# Patient Record
Sex: Male | Born: 1980 | Race: White | Hispanic: No | Marital: Single | Smoking: Current every day smoker
Health system: Southern US, Community
[De-identification: ages and names within clinical notes are randomized; demographics above are authoritative.]

## PROBLEM LIST (undated history)

## (undated) HISTORY — PX: SHOULDER ARTHROSCOPY: SHX128

---

## 2016-01-08 ENCOUNTER — Emergency Department: Payer: No Typology Code available for payment source

## 2016-01-08 ENCOUNTER — Encounter: Payer: Self-pay | Admitting: Emergency Medicine

## 2016-01-08 ENCOUNTER — Emergency Department
Admission: EM | Admit: 2016-01-08 | Discharge: 2016-01-08 | Disposition: A | Discharge status: Home / Self Care | Payer: No Typology Code available for payment source | Attending: Emergency Medicine | Admitting: Emergency Medicine

## 2016-01-08 DIAGNOSIS — Y9351 Activity, roller skating (inline) and skateboarding: Secondary | ICD-10-CM | POA: Insufficient documentation

## 2016-01-08 DIAGNOSIS — F17210 Nicotine dependence, cigarettes, uncomplicated: Secondary | ICD-10-CM | POA: Diagnosis not present

## 2016-01-08 DIAGNOSIS — Y9289 Other specified places as the place of occurrence of the external cause: Secondary | ICD-10-CM | POA: Insufficient documentation

## 2016-01-08 DIAGNOSIS — S43034A Inferior dislocation of right humerus, initial encounter: Secondary | ICD-10-CM | POA: Insufficient documentation

## 2016-01-08 DIAGNOSIS — Y998 Other external cause status: Secondary | ICD-10-CM | POA: Insufficient documentation

## 2016-01-08 DIAGNOSIS — S43014A Anterior dislocation of right humerus, initial encounter: Secondary | ICD-10-CM | POA: Diagnosis not present

## 2016-01-08 DIAGNOSIS — S43004A Unspecified dislocation of right shoulder joint, initial encounter: Secondary | ICD-10-CM

## 2016-01-08 DIAGNOSIS — S4991XA Unspecified injury of right shoulder and upper arm, initial encounter: Secondary | ICD-10-CM | POA: Diagnosis present

## 2016-01-08 MED ORDER — FENTANYL CITRATE (PF) 100 MCG/2ML IJ SOLN
50.0000 ug | INTRAMUSCULAR | Status: AC
  Administered 2016-01-08: 50 ug via INTRAVENOUS
  Filled 2016-01-08: qty 2

## 2016-01-08 MED ORDER — KETOROLAC TROMETHAMINE 30 MG/ML IJ SOLN
30.0000 mg | Freq: Once | INTRAMUSCULAR | Status: AC
  Administered 2016-01-08: 30 mg via INTRAVENOUS
  Filled 2016-01-08: qty 1

## 2016-01-08 MED ORDER — HYDROCODONE-ACETAMINOPHEN 5-325 MG PO TABS: 1.0000 | ORAL_TABLET | Freq: Four times a day (QID) | ORAL | Status: DC | PRN

## 2016-01-08 NOTE — ED Provider Notes (Signed)
[facility] Emergency Department Provider Note  ____________________________________________  Time seen: Approximately 6:02 PM  I have reviewed the triage vital signs and the nursing notes.   HISTORY  Chief Complaint Shoulder Injury    HPI Fernando Olson is a 35 y.o. male denies previous medical history.  Patient reports he was skateboarding when he fell backwards. He scraped his right elbow slightly, but started having severe pain in his right shoulder and thinks it is "dislocated". Denies any pain numbness tingling or weakness in the right hand, wrist, forearm. Denies pain in the elbow except it feels like "road rash". No loss of consciousness do not strike his head injures back or neck. No other injury.He thinks he had a tetanus shot a few years ago.  History reviewed. No pertinent past medical history.  There are no active problems to display for this patient.   History reviewed. No pertinent past surgical history.  Current Outpatient Rx  Name  Route  Sig  Dispense  Refill  . HYDROcodone-acetaminophen (NORCO/VICODIN) 5-325 MG tablet   Oral   Take 1 tablet by mouth every 6 (six) hours as needed for moderate pain.   15 tablet   0     Allergies Review of patient's allergies indicates no known allergies.  No family history on file.  Social History Social History  Substance Use Topics  . Smoking status: Current Every Day Smoker -- 1.00 packs/day    Types: Cigarettes  . Smokeless tobacco: Never Used  . Alcohol Use: Yes     Comment: daily wine    Review of Systems Constitutional: No fever/chills Eyes: No visual changes. ENT: No sore throat. Cardiovascular: Denies chest pain. Respiratory: Denies shortness of breath. Gastrointestinal: No abdominal pain.  No nausea, no vomiting.  No diarrhea.  No constipation. Genitourinary: Negative for dysuria. Musculoskeletal: Negative for back pain. Skin: Negative for rash except around the right  elbow. Neurological: Negative for headaches, focal weakness or numbness.  10-point ROS otherwise negative.  ____________________________________________   PHYSICAL EXAM:  VITAL SIGNS: ED Triage Vitals  Enc Vitals Group     BP 01/08/16 1605 134/90 mmHg     Pulse Rate 01/08/16 1605 83     Resp 01/08/16 1605 18     Temp 01/08/16 1605 98.3 F (36.8 C)     Temp Source 01/08/16 1605 Oral     SpO2 01/08/16 1605 95 %     Weight 01/08/16 1605 170 lb (77.111 kg)     Height 01/08/16 1605  (1.778 m)     Head Cir --      Peak Flow --      Pain Score 01/08/16 1606 5     Pain Loc --      Pain Edu? --      Excl. in GC? --    Constitutional: Alert and oriented. Well appearing and in no acute distress. Eyes: Conjunctivae are normal. PERRL. EOMI. Head: Atraumatic. Nose: No congestion/rhinnorhea. Mouth/Throat: Mucous membranes are moist.  Oropharynx non-erythematous. Neck: No stridor.  No cervical spine tenderness Cardiovascular: Normal rate, regular rhythm. Grossly normal heart sounds.  Good peripheral circulation. Respiratory: Normal respiratory effort.  No retractions. Lungs CTAB. Gastrointestinal: Soft and nontender. No distention. No abdominal bruits. No CVA tenderness. Right hand Median, ulnar, radial motor intact. Cap refill less than 2 seconds all digits. Strong radial pulse. 5 out of 5 strength throughout the hand intrinsics, flexion and extension at the wrist. No evidence of trauma.  Left hand Median, ulnar,  radial motor intact. Cap refill less than 2 seconds all digits. Strong radial pulse. 5 out of 5 strength throughout the hand intrinsics, flexion and extension at the wrist. No evidence of trauma. ____________________________________________  RIGHT Right upper extremity demonstrates normal strength, good use of all muscles except for pain and slight concave deformity just below the right shoulder joint. No edema bruising or contusions of the right shoulder/upper arm,  right elbow, right forearm / hand. There is a small approximately quarter size abrasion over the right elbow without evidence of foreign body, laceration. Patient ranges the elbow well without pain there is no tenderness to bony prominences. Full range of motion of the right right upper extremity without pain except at the shoulder joint. Strong radial pulse. Intact median/ulnar/radial neuro-muscular exam.  LEFT Left upper extremity demonstrates normal strength, good use of all muscles. No edema bruising or contusions of the left shoulder/upper arm, left elbow, left forearm / hand. Full range of motion of the left  upper extremity without pain. No evidence of trauma. Strong radial pulse. Intact median/ulnar/radial neuro-muscular exam.   Musculoskeletal: No lower extremity tenderness nor edema.  No joint effusions. Neurologic:  Normal speech and language. No gross focal neurologic deficits are appreciated. No gait instability. Skin:  Skin is warm, dry and intact. No rash noted. Psychiatric: Mood and affect are normal. Speech and behavior are normal.  ____________________________________________   LABS (all labs ordered are listed, but only abnormal results are displayed)  Labs Reviewed - No data to display ____________________________________________  EKG   ____________________________________________  RADIOLOGY  DG Shoulder Right (Final result) Result time: 01/08/16 17:50:29   Final result by Rad Results In Interface (01/08/16 17:50:29)   Narrative:   CLINICAL DATA: Status post reduction  EXAM: RIGHT SHOULDER - 2+ VIEW  COMPARISON: Film from earlier in the same day  FINDINGS: There is been interval reduction of the humeral head dislocation. It now lies within the glenoid fossa. The previously seen avulsion fracture is not well appreciated on this exam.  IMPRESSION: Interval reduction of the humeral head   Electronically Signed By: Alcide Clever M.D. On:  01/08/2016 17:50          DG Shoulder Right (Final result) Result time: 01/08/16 16:50:03   Final result by Rad Results In Interface (01/08/16 16:50:03)   Narrative:   CLINICAL DATA: Recent fall from skateboard with right shoulder pain and inability to move arm, initial encounter  EXAM: RIGHT SHOULDER - 2+ VIEW  COMPARISON: None.  FINDINGS: There is anterior inferior dislocation of the right humeral head with respect to the glenoid. A small defect is noted from the superior aspect of the humeral head consistent with a Hill-Sachs deformity. The bony density is noted adjacent which may represent an acute avulsion related to the dislocation.  IMPRESSION: Right humeral dislocation with apparent avulsion fracture.   Electronically Signed By: Alcide Clever M.D. On: 01/08/2016 16:50    ____________________________________________   PROCEDURES  Procedure(s) performed: Shoulder reduction  Critical Care performed: No  Reduction of dislocation Date/Time: 6:19 PM Performed by: Sharyn Creamer Authorized by: Sharyn Creamer Consent: Verbal consent obtained. Risks and benefits: risks, benefits and alternatives were discussed Consent given by: patient Required items: required blood products, implants, devices, and special equipment available Time out: Immediately prior to procedure a "time out" was called to verify the correct patient, procedure, equipment, support staff and site/side marked as required.   Vitals: Vital signs were monitored during sedation. Patient tolerance: Patient tolerated the procedure well with  no immediate complications. Joint: Right shoulder Reduction technique: FARES  Normal radial pulse, capillary refill, and no sensory exam of the right hand postreduction. No numbness or tingling over the lateral right arm. Patient tolerated procedure extremely well.    ____________________________________________   INITIAL IMPRESSION / ASSESSMENT AND PLAN  / ED COURSE  Pertinent labs & imaging results that were available during my care of the patient were reviewed by me and considered in my medical decision making (see chart for details).  Fall. The patient has right shoulder dislocation, now reduced. No other evidence of injury aside from small abrasion to the right elbow.  I will prescribe the patient a narcotic pain medicine due to their condition which I anticipate will cause at least moderate pain short term. I discussed with the patient safe use of narcotic pain medicines, and that they are not to drive, work in dangerous areas, or ever take more than prescribed (no more than 1 pill every 6 hours). We discussed that this is the type of medication that can be  overdosed on and the risks of this type of medicine. Patient is very agreeable to only use as prescribed and to never use more than prescribed.  Return precautions and treatment recommendations and follow-up discussed with the patient who is agreeable with the plan.  ____________________________________________   FINAL CLINICAL IMPRESSION(S) / ED DIAGNOSES  Final diagnoses:  Shoulder dislocation, right, initial encounter      Sharyn Creamer, MD 01/08/16 1819

## 2016-01-08 NOTE — ED Notes (Signed)
MD Quale at bedside attempting to reduce the dislocated right shoulder, pt stable, NAD noted. Vitals WNL.

## 2016-01-08 NOTE — ED Notes (Signed)
Put sling to right arm on patient, patient stated sling made pain worse and requested to remove sling.

## 2016-01-08 NOTE — Discharge Instructions (Signed)
You have been seen in the Emergency Department (ED) today for a shoulder dislocation.  It was put back in place in the ED.  Please follow up as written with the recommend orthopedic surgeon.  It is important you leave the shoulder immobilizer in place until they tell you to remove it.  Take hydrocodone as prescribed for severe pain. Do not drink alcohol, drive or participate in any other potentially dangerous activities while taking this medication as it may make you sleepy. Do not take this medication with any other sedating medications, either prescription or over-the-counter. If you were prescribed Percocet or Vicodin, do not take these with acetaminophen (Tylenol) as it is already contained within these medications.   This medication is an opiate (or narcotic) pain medication and can be habit forming.  Use it as little as possible to achieve adequate pain control.  Do not use or use it with extreme caution if you have a history of opiate abuse or dependence.  If you are on a pain contract with your primary care doctor or a pain specialist, be sure to let them know you were prescribed this medication today from the [facility] Emergency Department.  This medication is intended for your use only - do not give any to anyone else and keep it in a secure place where nobody else, especially children, have access to it.  It will also cause or worsen constipation, so you may want to consider taking an over-the-counter stool softener while you are taking this medication.  If you have worsening pain or swelling, if the shoulder dislocates again, or if you have any other symptoms that concern you, please return immediately to the Emergency Department.   Shoulder Dislocation A shoulder dislocation happens when the upper arm bone (humerus) moves out of the shoulder joint. The shoulder joint is the part of the shoulder where the humerus, shoulder blade (scapula), and collarbone (clavicle) meet. CAUSES This  condition is often caused by:  A fall.  A hit to the shoulder.  A forceful movement of the shoulder. RISK FACTORS This condition is more likely to develop in people who play sports. SYMPTOMS Symptoms of this condition include:  Deformity of the shoulder.  Intense pain.  Inability to move the shoulder.  Numbness, weakness, or tingling in your neck or down your arm.  Bruising or swelling around your shoulder. DIAGNOSIS This condition is diagnosed with a physical exam. After the exam, tests may be done to check for related problems. Tests that may be done include:  X-ray. This may be done to check for broken bones.  MRI. This may be done to check for damage to the tissues around the shoulder.  Electromyogram. This may be done to check for nerve damage. TREATMENT This condition is treated with a procedure to place the humerus back in the joint. This procedure is called a reduction. There are two types of reduction:  Closed reduction. In this procedure, the humerus is placed back in the joint without surgery. The health care provider uses his or her hands to guide the bone back into place.  Open reduction. In this procedure, the humerus is placed back in the joint with surgery. An open reduction may be recommended if:  You have a weak shoulder joint or weak ligaments.  You have had more than one shoulder dislocation.  The nerves or blood vessels around your shoulder have been damaged. After the humerus is placed back into the joint, your arm will be placed  in a splint or sling to prevent it from moving. You will need to wear the splint or sling until your shoulder heals. When the splint or sling is removed, you may have physical therapy to help improve the range of motion in your shoulder joint. HOME CARE INSTRUCTIONS If You Have a Splint or Sling:  Wear it as told by your health care provider. Remove it only as told by your health care provider.  Loosen it if your fingers  become numb and tingle, or if they turn cold and blue.  Keep it clean and dry. Bathing  Do not take baths, swim, or use a hot tub until your health care provider approves. Ask your health care provider if you can take showers. You may only be allowed to take sponge baths for bathing.  If your health care provider approves bathing and showering, cover your splint or sling with a watertight plastic bag to protect it from water. Do not let the splint or sling get wet. Managing Pain, Stiffness, and Swelling  If directed, apply ice to the injured area.  Put ice in a plastic bag.  Place a towel between your skin and the bag.  Leave the ice on for 20 minutes, 2-3 times per day.  Move your fingers often to avoid stiffness and to decrease swelling.  Raise (elevate) the injured area above the level of your heart while you are sitting or lying down. Driving  Do not drive while wearing a splint or sling on a hand that you use for driving.  Do not drive or operate heavy machinery while taking pain medicine. Activity  Return to your normal activities as told by your health care provider. Ask your health care provider what activities are safe for you.  Perform range-of-motion exercises only as told by your health care provider.  Exercise your hand by squeezing a soft ball. This helps to decrease stiffness and swelling in your hand and wrist. General Instructions  Take over-the-counter and prescription medicines only as told by your health care provider.  Do not use any tobacco products, including cigarettes, chewing tobacco, or e-cigarettes. Tobacco can delay bone and tissue healing. If you need help quitting, ask your health care provider.  Keep all follow-up visits as told by your health care provider. This is important. SEEK MEDICAL CARE IF:  Your splint or sling gets damaged. SEEK IMMEDIATE MEDICAL CARE IF:  Your pain gets worse rather than better.  You lose feeling in your arm or  hand.  Your arm or hand becomes white and cold.   This information is not intended to replace advice given to you by your health care provider. Make sure you discuss any questions you have with your health care provider.   Document Released: 07/26/2001 Document Revised: 07/22/2015 Document Reviewed: 02/23/2015 Elsevier Interactive Patient Education [employer]

## 2016-01-08 NOTE — ED Notes (Signed)
Skateboarding, fell right arm went up when falling.  Possible right shoulder dislocation.

## 2016-01-08 NOTE — ED Notes (Signed)
AAOx3.  Skin warm and dry.  Right shoulder deformity.  +brisk capillary refill. + sensation.

## 2016-01-08 NOTE — ED Notes (Signed)
Patient transported to XRAY 

## 2020-07-07 ENCOUNTER — Emergency Department
Admission: EM | Admit: 2020-07-07 | Discharge: 2020-07-07 | Disposition: A | Discharge status: Home / Self Care | Payer: PRIVATE HEALTH INSURANCE | Attending: Emergency Medicine | Admitting: Emergency Medicine

## 2020-07-07 ENCOUNTER — Other Ambulatory Visit: Payer: Self-pay

## 2020-07-07 DIAGNOSIS — Y9389 Activity, other specified: Secondary | ICD-10-CM | POA: Insufficient documentation

## 2020-07-07 DIAGNOSIS — W3189XA Contact with other specified machinery, initial encounter: Secondary | ICD-10-CM | POA: Insufficient documentation

## 2020-07-07 DIAGNOSIS — Y999 Unspecified external cause status: Secondary | ICD-10-CM | POA: Diagnosis not present

## 2020-07-07 DIAGNOSIS — Z23 Encounter for immunization: Secondary | ICD-10-CM | POA: Diagnosis not present

## 2020-07-07 DIAGNOSIS — Y929 Unspecified place or not applicable: Secondary | ICD-10-CM | POA: Insufficient documentation

## 2020-07-07 DIAGNOSIS — S61512A Laceration without foreign body of left wrist, initial encounter: Secondary | ICD-10-CM | POA: Diagnosis not present

## 2020-07-07 DIAGNOSIS — F17210 Nicotine dependence, cigarettes, uncomplicated: Secondary | ICD-10-CM | POA: Diagnosis not present

## 2020-07-07 MED ORDER — TETANUS-DIPHTH-ACELL PERTUSSIS 5-2.5-18.5 LF-MCG/0.5 IM SUSP: 0.5000 mL | Freq: Once | INTRAMUSCULAR | Status: AC

## 2020-07-07 MED ORDER — CEPHALEXIN 500 MG PO CAPS: 1000.0000 mg | ORAL_CAPSULE | Freq: Two times a day (BID) | ORAL | 0 refills | Status: DC

## 2020-07-07 MED ORDER — TETANUS-DIPHTH-ACELL PERTUSSIS 5-2.5-18.5 LF-MCG/0.5 IM SUSP
INTRAMUSCULAR | Status: AC
  Administered 2020-07-07: 0.5 mL via INTRAMUSCULAR
  Filled 2020-07-07: qty 0.5

## 2020-07-07 NOTE — ED Provider Notes (Signed)
[facility] Emergency Department Provider Note  ____________________________________________  Time seen: Approximately 7:00 PM  I have reviewed the triage vital signs and the nursing notes.   HISTORY  Chief Complaint Laceration    HPI Fernando Olson is a 39 y.o. male who presents the emergency department complaining of a laceration to the left wrist.  Patient was using an angle grinder when it slipped.  Patient sustained a laceration to the left wrist.  Full range of motion to the left wrist and all digits.  Bleeding was controlled by direct pressure.  Unsure of last tetanus shot.  No other injury or complaint.         History reviewed. No pertinent past medical history.  There are no problems to display for this patient.   History reviewed. No pertinent surgical history.  Prior to Admission medications   Medication Sig Start Date End Date Taking? Authorizing Provider  cephALEXin (KEFLEX) 500 MG capsule Take 2 capsules (1,000 mg total) by mouth 2 (two) times daily. 07/07/20   Jaizon Deroos, Delorise Royals, PA-C  HYDROcodone-acetaminophen (NORCO/VICODIN) 5-325 MG tablet Take 1 tablet by mouth every 6 (six) hours as needed for moderate pain. 01/08/16   Sharyn Creamer, MD    Allergies Patient has no known allergies.  No family history on file.  Social History Social History   Tobacco Use  . Smoking status: Current Every Day Smoker    Packs/day: 1.00    Types: Cigarettes  . Smokeless tobacco: Never Used  Substance Use Topics  . Alcohol use: Yes    Comment: daily wine  . Drug use: No     Review of Systems  Constitutional: No fever/chills Eyes: No visual changes. No discharge ENT: No upper respiratory complaints. Cardiovascular: no chest pain. Respiratory: no cough. No SOB. Gastrointestinal: No abdominal pain.  No nausea, no vomiting.  No diarrhea.  No constipation. Musculoskeletal: Negative for musculoskeletal pain. Skin: Left wrist  laceration Neurological: Negative for headaches, focal weakness or numbness. 10-point ROS otherwise negative.  ____________________________________________   PHYSICAL EXAM:  VITAL SIGNS: ED Triage Vitals [07/07/20 1600]  Enc Vitals Group     BP 103/71     Pulse Rate 81     Resp 18     Temp 98.3 F (36.8 C)     Temp src      SpO2 97 %     Weight 155 lb (70.3 kg)     Height 5\' 10"  (1.778 m)     Head Circumference      Peak Flow      Pain Score 7     Pain Loc      Pain Edu?      Excl. in GC?      Constitutional: Alert and oriented. Well appearing and in no acute distress. Eyes: Conjunctivae are normal. PERRL. EOMI. Head: Atraumatic. ENT:      Ears:       Nose: No congestion/rhinnorhea.      Mouth/Throat: Mucous membranes are moist.  Neck: No stridor.    Cardiovascular: Normal rate, regular rhythm. Normal S1 and S2.  Good peripheral circulation. Respiratory: Normal respiratory effort without tachypnea or retractions. Lungs CTAB. Good air entry to the bases with no decreased or absent breath sounds. Musculoskeletal: Full range of motion to all extremities. No gross deformities appreciated. Neurologic:  Normal speech and language. No gross focal neurologic deficits are appreciated.  Skin:  Skin is warm, dry and intact. No rash noted.  Visualization of the left  wrist reveals approximately 4 cm laceration.  This involves the epidermal and dermal tissue but does not involve subcutaneous tissue or deep underlying structures.  Good range of motion.  Full range of motion to all digits distally.  Edges are smooth in nature.  Debris in the medial aspect of the laceration consistent with granular debris but no solid foreign body. Psychiatric: Mood and affect are normal. Speech and behavior are normal. Patient exhibits appropriate insight and judgement.   ____________________________________________   LABS (all labs ordered are listed, but only abnormal results are displayed)  Labs  Reviewed - No data to display ____________________________________________  EKG   ____________________________________________  RADIOLOGY   No results found.  ____________________________________________    PROCEDURES  Procedure(s) performed:    Marland KitchenMarland KitchenLaceration Repair  Date/Time: 07/07/2020 7:32 PM Performed by: Racheal Patches, PA-C Authorized by: Racheal Patches, PA-C   Consent:    Consent obtained:  Verbal   Consent given by:  Patient   Risks discussed:  Pain, poor cosmetic result, poor wound healing, infection and retained foreign body Anesthesia (see MAR for exact dosages):    Anesthesia method:  None Laceration details:    Location:  Shoulder/arm   Shoulder/arm location:  L lower arm   Length (cm):  4 Repair type:    Repair type:  Simple Pre-procedure details:    Preparation:  Patient was prepped and draped in usual sterile fashion Exploration:    Hemostasis achieved with:  Direct pressure   Wound exploration: wound explored through full range of motion and entire depth of wound probed and visualized     Wound extent: foreign bodies/material     Wound extent: no muscle damage noted, no nerve damage noted, no tendon damage noted, no underlying fracture noted and no vascular damage noted     Foreign bodies/material:  Grinding wheel dust   Contaminated: yes   Treatment:    Area cleansed with:  Betadine and saline   Amount of cleaning:  Extensive   Irrigation solution:  Sterile saline   Irrigation volume:  2 L   Irrigation method:  Syringe   Visualized foreign bodies/material removed: yes   Skin repair:    Repair method: Derma-clips. Approximation:    Approximation:  Close Post-procedure details:    Dressing:  Splint for protection   Patient tolerance of procedure:  Tolerated well, no immediate complications Comments:     Wound was thoroughly cleansed out.  All contamination visible was removed.  Patient adamantly requested closure other than  sutures.  Derma clips applied with 2 derma clips applied.  Wrist is splinted to limit range of motion.      Medications  Tdap (BOOSTRIX) injection 0.5 mL (0.5 mLs Intramuscular Given 07/07/20 1930)     ____________________________________________   INITIAL IMPRESSION / ASSESSMENT AND PLAN / ED COURSE  Pertinent labs & imaging results that were available during my care of the patient were reviewed by me and considered in my medical decision making (see chart for details).  Review of the [place] CSRS was performed in accordance of the [facility] prior to dispensing any controlled drugs.           Patient's diagnosis is consistent with wrist laceration.  Patient presented to emergency department after sustaining a laceration to left anterior wrist from a grinding wheel.  Patient had some debris in the wound that was thoroughly irrigated, cleansed.  All visible retained material was removed.  Patient adamantly did not want sutures.  I discussed pros and  cons of sutures, alternative methods of closure.  Patient requested any method other than sutures.  As such derma clips are applied patient's wrist is splinted.  Edges are well approximated.  Patient tolerated well.  Wound care instructions discussed with the patient.  Patient will have his tetanus shot updated at this time.  Antibiotics prophylactically..  Patient is given ED precautions to return to the ED for any worsening or new symptoms.     ____________________________________________  FINAL CLINICAL IMPRESSION(S) / ED DIAGNOSES  Final diagnoses:  Laceration of left wrist, initial encounter      NEW MEDICATIONS STARTED DURING THIS VISIT:  ED Discharge Orders         Ordered    cephALEXin (KEFLEX) 500 MG capsule  2 times daily        07/07/20 1937              This chart was dictated using voice recognition software/Dragon. Despite best efforts to proofread, errors can occur which can change the meaning. Any change was  purely unintentional.    Racheal Patches, PA-C 07/07/20 1939    Gilles Chiquito, MD 07/07/20 1944

## 2020-07-07 NOTE — ED Triage Notes (Signed)
Pt comes via POV from home with c/o laceration to left wrist following a grinder accident. Pt states deep cut to wrist.  Pt has bandage in place with bleeding controlled.  Pt states this happened at work. Pt may file for WC. Pt states employer sent him here

## 2020-07-07 NOTE — ED Notes (Signed)
Pt presents to the ED for a laceration to the L wrist after an accident with a grinder. Pt is A&Ox4 and NAD. Strong pulses bilaterally and able to bend fingers at this time.

## 2020-07-07 NOTE — ED Notes (Signed)
No reaction noted to the injection site. 

## 2022-01-28 ENCOUNTER — Emergency Department
Admission: EM | Admit: 2022-01-28 | Discharge: 2022-01-28 | Disposition: A | Discharge status: Home / Self Care | Payer: PRIVATE HEALTH INSURANCE | Attending: Emergency Medicine | Admitting: Emergency Medicine

## 2022-01-28 ENCOUNTER — Other Ambulatory Visit: Payer: Self-pay

## 2022-01-28 ENCOUNTER — Emergency Department: Payer: PRIVATE HEALTH INSURANCE

## 2022-01-28 DIAGNOSIS — R600 Localized edema: Secondary | ICD-10-CM | POA: Insufficient documentation

## 2022-01-28 DIAGNOSIS — M7989 Other specified soft tissue disorders: Secondary | ICD-10-CM

## 2022-01-28 DIAGNOSIS — R609 Edema, unspecified: Secondary | ICD-10-CM

## 2022-01-28 DIAGNOSIS — L03116 Cellulitis of left lower limb: Secondary | ICD-10-CM | POA: Insufficient documentation

## 2022-01-28 LAB — COMPREHENSIVE METABOLIC PANEL
ALT: 46 U/L — ABNORMAL HIGH (ref 0–44)
AST: 33 U/L (ref 15–41)
Albumin: 3.5 g/dL (ref 3.5–5.0)
Alkaline Phosphatase: 79 U/L (ref 38–126)
Anion gap: 5 (ref 5–15)
BUN: 14 mg/dL (ref 6–20)
CO2: 28 mmol/L (ref 22–32)
Calcium: 8.8 mg/dL — ABNORMAL LOW (ref 8.9–10.3)
Chloride: 100 mmol/L (ref 98–111)
Creatinine, Ser: 1.06 mg/dL (ref 0.61–1.24)
GFR, Estimated: >60 mL/min (ref >60)
Glucose, Bld: 100 mg/dL — ABNORMAL HIGH (ref 70–99)
Potassium: 4.6 mmol/L (ref 3.5–5.1)
Sodium: 133 mmol/L — ABNORMAL LOW (ref 135–145)
Total Bilirubin: 1 mg/dL (ref 0.3–1.2)
Total Protein: 7.6 g/dL (ref 6.5–8.1)

## 2022-01-28 LAB — BRAIN NATRIURETIC PEPTIDE: B Natriuretic Peptide: 21.4 pg/mL (ref 0.0–100.0)

## 2022-01-28 LAB — CBC WITH DIFFERENTIAL/PLATELET
Abs Immature Granulocytes: 0.02 10*3/uL (ref 0.00–0.07)
Basophils Absolute: 0 10*3/uL (ref 0.0–0.1)
Basophils Relative: 1 %
Eosinophils Absolute: 0.2 10*3/uL (ref 0.0–0.5)
Eosinophils Relative: 4 %
HCT: 39.6 % (ref 39.0–52.0)
Hemoglobin: 12.8 g/dL — ABNORMAL LOW (ref 13.0–17.0)
Immature Granulocytes: 0 %
Lymphocytes Relative: 24 %
Lymphs Abs: 1.6 10*3/uL (ref 0.7–4.0)
MCH: 28.4 pg (ref 26.0–34.0)
MCHC: 32.3 g/dL (ref 30.0–36.0)
MCV: 88 fL (ref 80.0–100.0)
Monocytes Absolute: 0.8 10*3/uL (ref 0.1–1.0)
Monocytes Relative: 11 %
Neutro Abs: 4.2 10*3/uL (ref 1.7–7.7)
Neutrophils Relative %: 60 %
Platelets: 506 10*3/uL — ABNORMAL HIGH (ref 150–400)
RBC: 4.5 MIL/uL (ref 4.22–5.81)
RDW: 12.7 % (ref 11.5–15.5)
WBC: 6.8 10*3/uL (ref 4.0–10.5)
nRBC: 0 % (ref 0.0–0.2)

## 2022-01-28 LAB — TSH: TSH: 0.742 u[IU]/mL (ref 0.350–4.500)

## 2022-01-28 LAB — SEDIMENTATION RATE: Sed Rate: 63 mm/hr — ABNORMAL HIGH (ref 0–15)

## 2022-01-28 MED ORDER — CEPHALEXIN 500 MG PO CAPS: 500.0000 mg | ORAL_CAPSULE | Freq: Three times a day (TID) | ORAL | 0 refills | Status: DC

## 2022-01-28 NOTE — ED Triage Notes (Addendum)
Patient to ER via POV with complaints of bilateral leg swelling and possible cellulitis.  ? 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 States he often gets splinters/ metal splinters in his feet and legs.  ? ?Patient did a telehealth visit and was started on bactrim, has been taking it for 8 days with some initial improvement but missed one dose and states his legs have worsened. Has also been wearing compression socks with little improvement in swelling.  ? ?Unsure of last tetanus shot.  ?

## 2022-01-28 NOTE — ED Provider Notes (Signed)
? ?[facility] ?Provider Note ? ? ? Event Date/Time  ? First MD Initiated Contact with Patient 01/28/22 1001   ?  (approximate) ? ? ?History  ? ?Leg Swelling ? ? ?HPI ? ?Fernando Olson is a 41 y.o. male presents emergency department stating that he works as a [employer] certified accountant and over 8 days ago noticed that his legs were both red painful and swollen.  He was concerned as he thought he may have splinters in his legs.  Called his physician and did a telehealth visit and they started him on Bactrim.  Patient states he has been hardly able to walk due to the pain and swelling.  States both legs have a large amount of swelling around the ankles and at first he thought he may have sprained it.  No chest pain/shortness of breath.  No recent vaccinations.  Is vaccinated for COVID.  Denies fever or chills ? ?  ? ? ?Physical Exam  ? ?Triage Vital Signs: ?ED Triage Vitals  ?Enc Vitals Group  ?   BP 01/28/22 0941 91/72  ?   Pulse Rate 01/28/22 0941 96  ?   Resp 01/28/22 0941 17  ?   Temp 01/28/22 0941 98.5 ?F (36.9 ?C)  ?   Temp Source 01/28/22 0941 Oral  ?   SpO2 01/28/22 0941 100 %  ?   Weight 01/28/22 1008 154 lb 15.7 oz (70.3 kg)  ?   Height 01/28/22 0941 5\' 10"  (1.778 m)  ?   Head Circumference --   ?   Peak Flow --   ?   Pain Score 01/28/22 0941 0  ?   Pain Loc --   ?   Pain Edu? --   ?   Excl. in GC? --   ? ? ?Most recent vital signs: ?Vitals:  ? 01/28/22 1209 01/28/22 1342  ?BP: 115/81 118/78  ?Pulse: 89 84  ?Resp: 18 18  ?Temp:    ?SpO2: 99% 99%  ? ? ? ?General: Awake, no distress.   ?CV:  Good peripheral perfusion. regular rate and  rhythm ?Resp:  Normal effort. Lungs CTA ?Abd:  No distention.   ?Other:  Both legs have large amount of swelling noted around the ankles and up the lower extremity to the knee, pitting edema is noted, redness is noted on the anterior shins bilaterally, neurovascular is intact ? ? ?ED Results / Procedures / Treatments  ? ?Labs ?(all labs ordered are listed, but only abnormal  results are displayed) ?Labs Reviewed  ?CBC WITH DIFFERENTIAL/PLATELET - Abnormal; Notable for the following components:  ?    Result Value  ? Hemoglobin 12.8 (*)   ? Platelets 506 (*)   ? All other components within normal limits  ?COMPREHENSIVE METABOLIC PANEL - Abnormal; Notable for the following components:  ? Sodium 133 (*)   ? Glucose, Bld 100 (*)   ? Calcium 8.8 (*)   ? ALT 46 (*)   ? All other components within normal limits  ?SEDIMENTATION RATE - Abnormal; Notable for the following components:  ? Sed Rate 63 (*)   ? All other components within normal limits  ?BRAIN NATRIURETIC PEPTIDE  ?TSH  ? ? ? ?EKG ? ? ? ? ?RADIOLOGY ?X-ray of the right and left tib-fib, chest x-ray ? ? ? ?PROCEDURES: ? ? ?Procedures ? ? ?MEDICATIONS ORDERED IN ED: ?Medications - No data to display ? ? ?IMPRESSION / MDM / ASSESSMENT AND PLAN / ED COURSE  ?I reviewed the triage  vital signs and the nursing notes. ?             ?               ? ?Differential diagnosis includes, but is not limited to, cellulitis, CHF, metabolic disorder, osteomyelitis, DVT ? ?Plan at this time is to do imaging and labs. ? ?Patient CBC shows an elevated platelet of 506.  Comprehensive metabolic panel is normal, TSH is normal, BNP is normal, sed rate is elevated at 63. ? ?X-rays were independently reviewed by me, x-ray of the right tib-fib, tib-fib, are both negative for fracture ? ?Chest x-ray was independently reviewed by me and I do not see any fluid around patient's heart.  Confirmed by radiology no CHF, states no acute abnormality. ? ?I did explain findings to the patient.  Dr. Sidney Ace was in to see the patient.  She states okay to discharge with prescription for Keflex.  He is to follow-up with his regular doctor if not improving in 3 to 4 days.  Return emergency department worsening.  Patient was in agreement treatment plan.  Discharged stable condition. ? ? ? ? ?  ? ? ?FINAL CLINICAL IMPRESSION(S) / ED DIAGNOSES  ? ?Final diagnoses:  ?Peripheral edema   ?Leg swelling  ?Cellulitis of left lower extremity  ? ? ? ?Rx / DC Orders  ? ?ED Discharge Orders   ? ?      Ordered  ?  cephALEXin (KEFLEX) 500 MG capsule  3 times daily       ? 01/28/22 1331  ? ?  ?  ? ?  ? ? ? ?Note:  This document was prepared using Dragon voice recognition software and may include unintentional dictation errors. ? ?  ?Faythe Ghee, PA-C ?01/28/22 1641 ? ?  ?Georga Hacking, MD ?01/28/22 2017 ? ?

## 2022-01-28 NOTE — Discharge Instructions (Signed)
Follow-up with your regular doctor if not improving in 3 days.  Return emergency department worsening.  Take the antibiotic as prescribed.  Elevate your legs to help decrease the swelling. ?

## 2022-11-01 IMAGING — CR DG TIBIA/FIBULA 2V*R*
3 series · 3 of 3 positions shown · non-contrast
Comparison: None.

CLINICAL DATA: Bilateral leg swelling and possible cellulitis.
Reports he works as a machinist and [REDACTED] noticed both legs
were red, painful, and swollen. States he often gets splinters/
metal splinters in his feet and legs.

EXAM:
RIGHT TIBIA AND FIBULA - 2 VIEW

[tibia ap (1 of 2)]
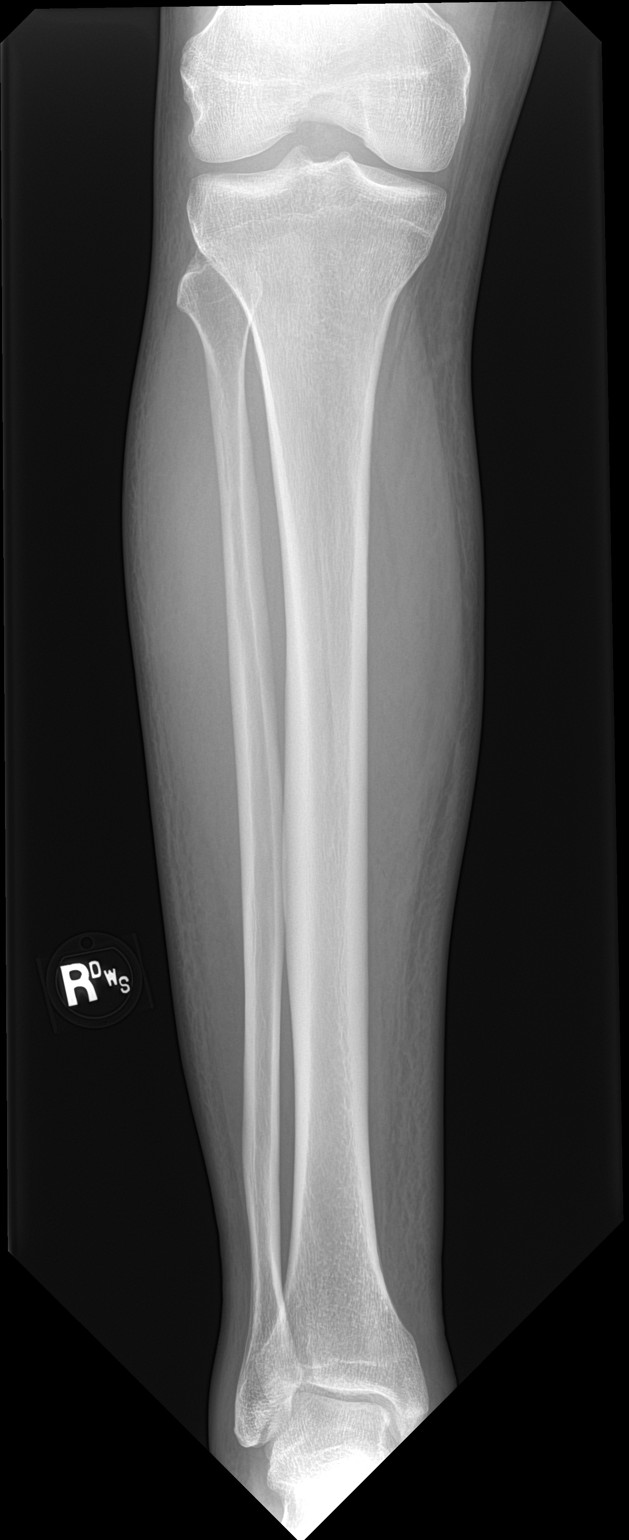

[tibia ap (2 of 2)]
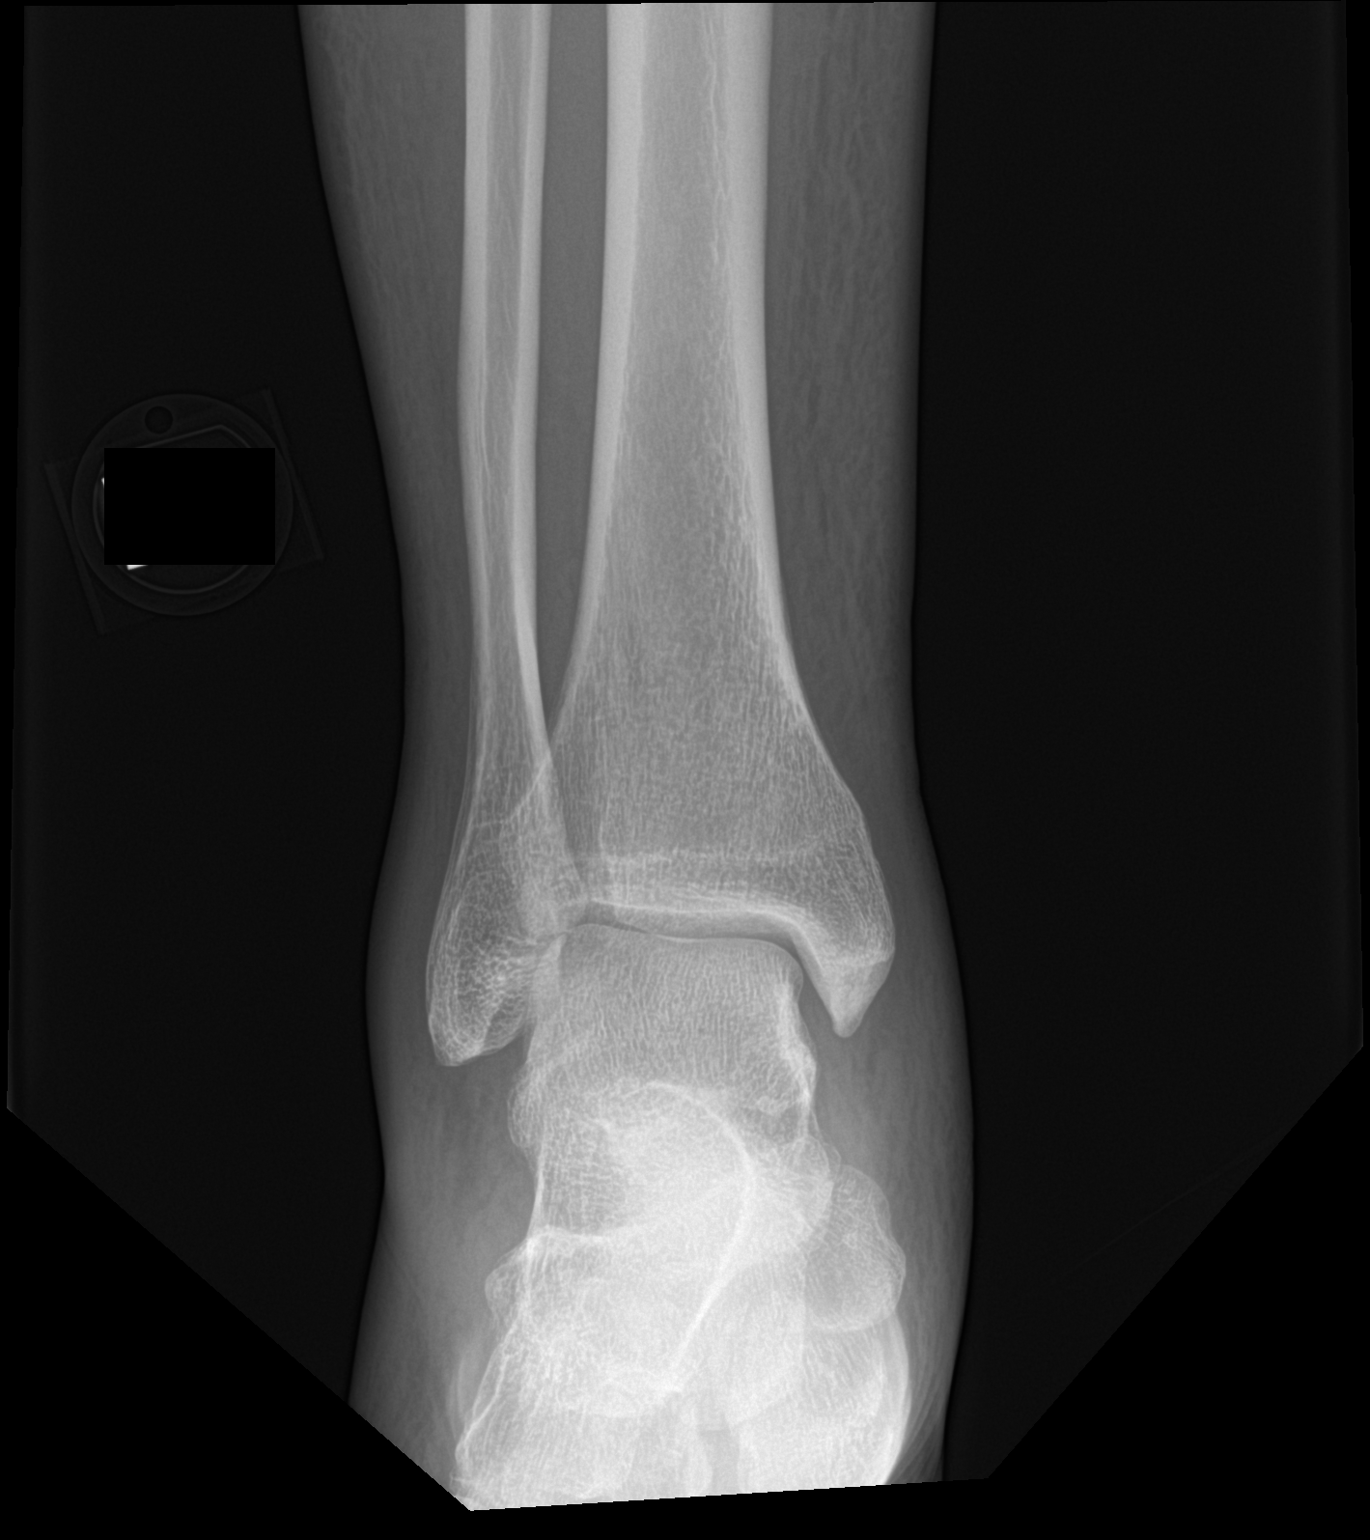

[tibia lat]
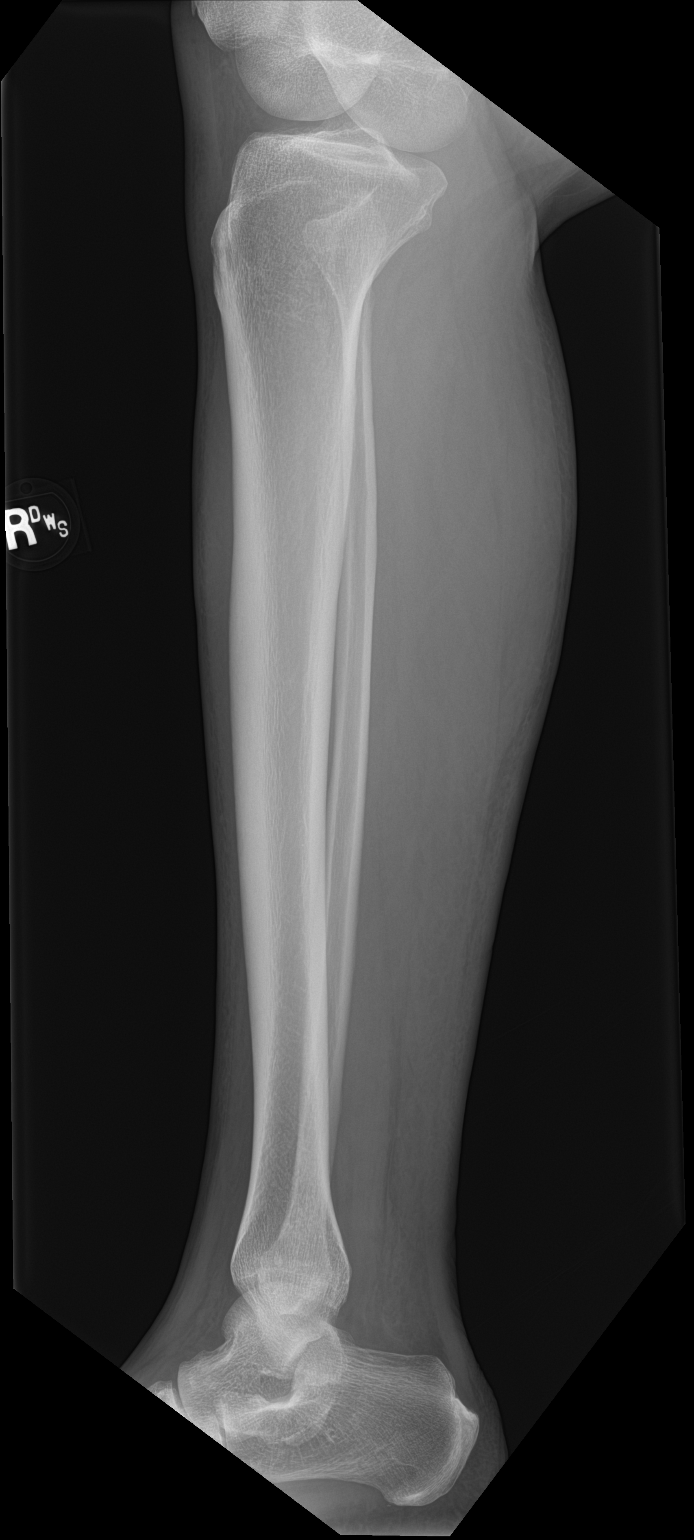

[3 of 3 positions shown; findings below may reference images not displayed]

FINDINGS: No fracture or bone lesion.

Knee and ankle joints are normally spaced and aligned. No
arthropathic changes.

Nonspecific soft tissue edema of the lower leg and ankle.
IMPRESSION: 1. Nonspecific soft tissue edema.
2. No fracture, bone lesion or joint abnormality.

## 2022-11-01 IMAGING — CR DG TIBIA/FIBULA 2V*L*
2 series · 2 of 2 positions shown · non-contrast
Comparison: None.

CLINICAL DATA: Redness and swelling of both lower extremities for 8
days. Possible cellulitis.

EXAM:
LEFT TIBIA AND FIBULA - 2 VIEW

[tibia ap]
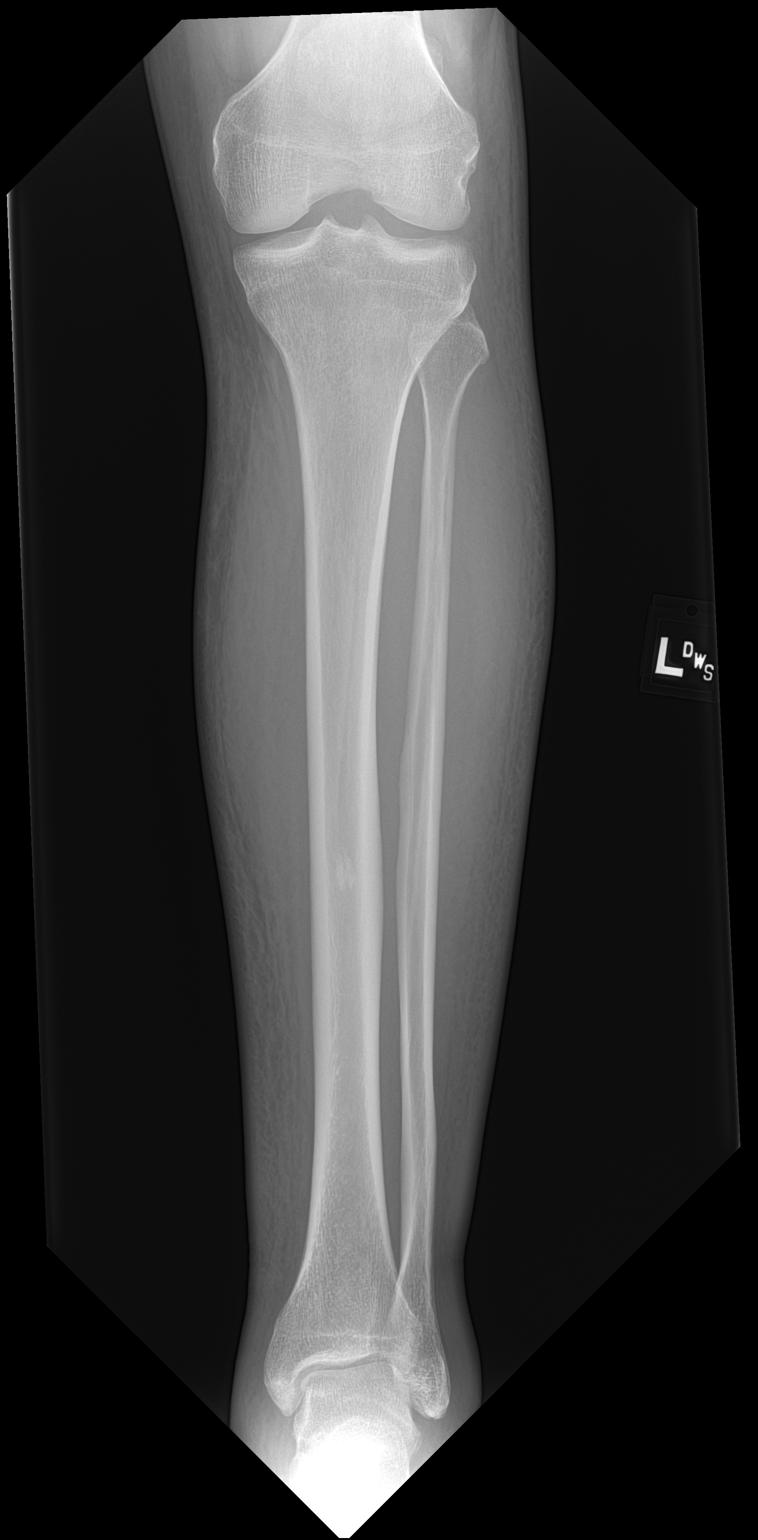

[tibia lat]
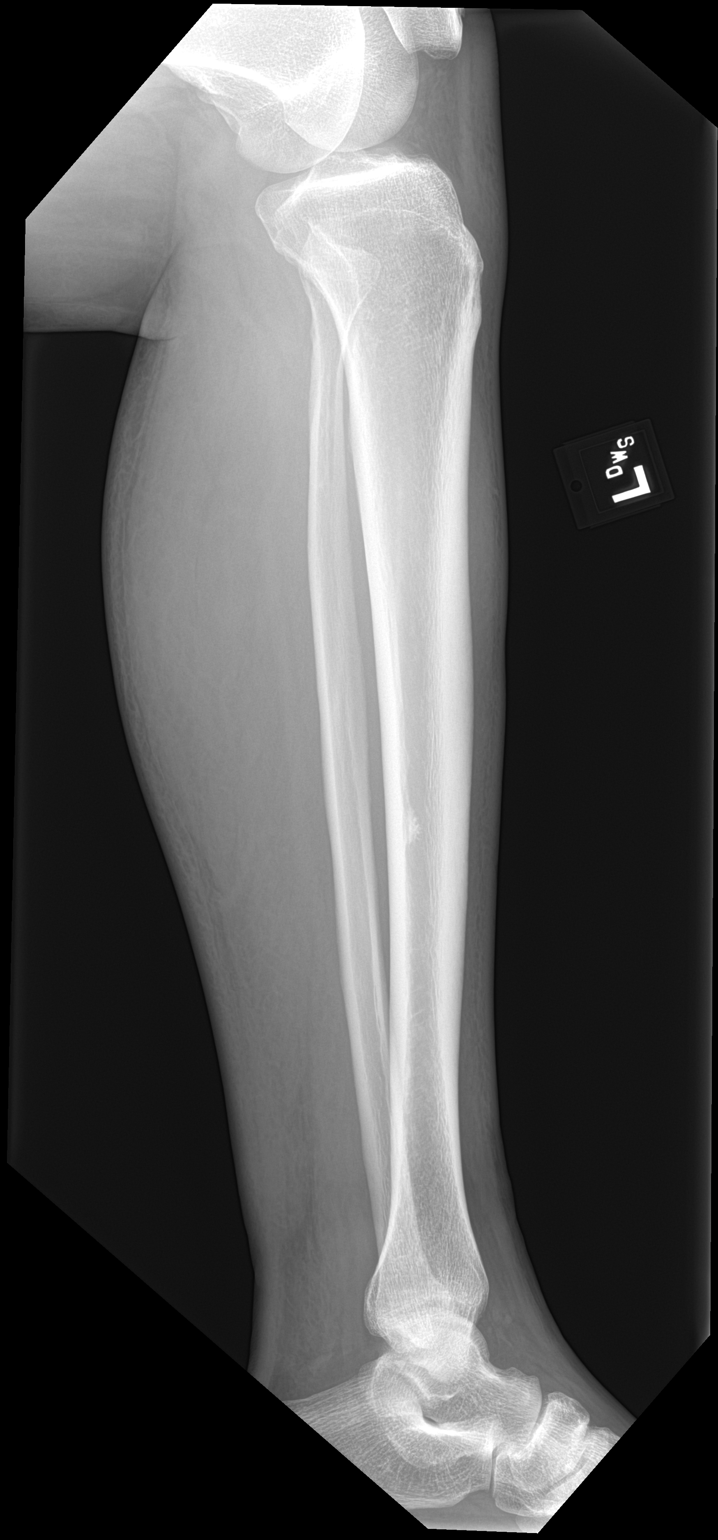

[2 of 2 positions shown; findings below may reference images not displayed]

FINDINGS: The mineralization and alignment are normal. There is no evidence of
acute fracture or dislocation. The joint spaces appear preserved.
Probable incidental bone island posteriorly in the mid tibial
diaphysis. Mild diffuse soft tissue swelling, nonspecific. No
evidence of foreign body or soft tissue emphysema.
IMPRESSION: Nonspecific soft tissue swelling/edema which could represent
cellulitis. No evidence of foreign body or acute osseous
abnormality.

## 2022-12-15 DEATH — deceased
